# Patient Record
Sex: Male | Born: 1995 | Race: White | Hispanic: No | Marital: Single | Smoking: Never smoker
Health system: Southern US, Community
[De-identification: ages and names within clinical notes are randomized; demographics above are authoritative.]

## PROBLEM LIST (undated history)

## (undated) DIAGNOSIS — J45909 Unspecified asthma, uncomplicated: Secondary | ICD-10-CM

## (undated) HISTORY — PX: ADENOIDECTOMY: SUR15

## (undated) HISTORY — PX: CIRCUMCISION: SUR203

## (undated) HISTORY — PX: TYMPANOSTOMY TUBE PLACEMENT: SHX32

---
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
## 2019-02-16 ENCOUNTER — Ambulatory Visit (INDEPENDENT_AMBULATORY_CARE_PROVIDER_SITE_OTHER): Payer: 59 | Admitting: Internal Medicine

## 2019-02-16 ENCOUNTER — Other Ambulatory Visit: Payer: Self-pay

## 2019-02-16 ENCOUNTER — Encounter: Payer: Self-pay | Admitting: Internal Medicine

## 2019-02-16 VITALS — BP 118/70 | HR 94 | Ht 71.0 in | Wt 169.0 lb

## 2019-02-16 DIAGNOSIS — J4520 Mild intermittent asthma, uncomplicated: Secondary | ICD-10-CM | POA: Diagnosis not present

## 2019-02-16 DIAGNOSIS — D171 Benign lipomatous neoplasm of skin and subcutaneous tissue of trunk: Secondary | ICD-10-CM | POA: Insufficient documentation

## 2019-02-16 NOTE — Patient Instructions (Signed)
Preventive Care for [place], Male The transition to life after high school as a young adult can be a stressful time with many changes. You may start seeing a primary care physician instead of a pediatrician. This is the time when your health care becomes your responsibility. Preventive care refers to lifestyle choices and visits with your health care provider that can promote health and wellness. What does preventive care include?  A yearly physical exam. This is also called an annual wellness visit.  Dental exams once or twice a year.  Routine eye exams. Ask your health care provider how often you should have your eyes checked.  Personal lifestyle choices, including: ? Daily care of your teeth and gums. ? Regular physical activity. ? Eating a healthy diet. ? Avoiding tobacco and drug use. ? Avoiding or limiting alcohol use. ? Practicing safe sex. What happens during an annual wellness visit? Preventive care starts with a yearly visit to your primary care physician. The services and screenings done by your health care provider during your annual wellness visit will depend on your overall health, lifestyle risk factors, and family history of disease. Counseling Your health care provider may ask you questions about:  Past medical problems and your family's medical history.  Medicines or supplements that you take.  Health insurance and access to health care.  Alcohol, tobacco, and drug use, including use of any bodybuilding drugs (anabolic steroids).  Your safety at home, work, or school.  Access to firearms.  Emotional well-being and how you cope with stress.  Relationship well-being.  Diet, exercise, and sleep habits.  Your sexual health and activity. Screening You may have the following tests or measurements:  Height, weight, and BMI.  Blood pressure.  Lipid and cholesterol levels.  Tuberculosis skin test.  Skin exam.  Vision and hearing tests.  Genital  exam to check for testicular cancer or hernias.  Screening test for hepatitis.  Screening tests for STDs (sexually transmitted diseases), if you are at risk. Vaccines Your health care provider may recommend certain vaccines, such as:  Influenza vaccine. This is recommended every year.  Tetanus, diphtheria, and acellular pertussis (Tdap, Td) vaccine. You may need a Td booster every 10 years.  Varicella vaccine. You may need this if you have not been vaccinated.  HPV vaccine. If you are 100 or younger, you may need three doses over 6 months.  Measles, mumps, and rubella (MMR) vaccine. You may need at least one dose of MMR. You may also need a second dose.  Pneumococcal 13-valent conjugate (PCV13) vaccine. You may need this if you have certain conditions and have not been vaccinated.  Pneumococcal polysaccharide (PPSV23) vaccine. You may need one or two doses if you smoke cigarettes or if you have certain conditions.  Meningococcal vaccine. One dose is recommended if you are age 48-21 years and a first-year college student living in a residence hall, or if you have one of several medical conditions. You may also need additional booster doses.  Hepatitis A vaccine. You may need this if you have certain conditions or if you travel or work in places where you may be exposed to hepatitis A.  Hepatitis B vaccine. You may need this if you have certain conditions or if you travel or work in places where you may be exposed to hepatitis B.  Haemophilus influenzae type b (Hib) vaccine. You may need this if you have certain risk factors. Talk to your health care provider about which screenings and vaccines  you need and how often you need them. What steps can I take to develop healthy behaviors?      Have regular preventive health care visits with your primary care physician and dentist.  Eat a healthy diet.  Drink enough fluid to keep your urine clear or pale yellow.  Stay active. Exercise  at least 30 minutes 5 or more days of the week.  Use alcohol responsibly.  Maintain a healthy weight.  Do not use any products that contain nicotine, such as cigarettes, chewing tobacco, and e-cigarettes. If you need help quitting, ask your health care provider.  Do not use drugs.  Practice safe sex. This includes using condoms to prevent STDs or an unwanted pregnancy.  Find healthy ways to manage stress. How can I protect myself from injury? Injuries from violence or accidents are the leading cause of death among young adults and can often be prevented. Take these steps to help protect yourself:  Always wear your seat belt while driving or riding in a vehicle.  Do not drive if you have been drinking alcohol. Do not ride with someone who has been drinking.  Do not drive when you are tired or distracted. Do not text while driving.  Wear a helmet and other protective equipment during sports activities.  If you have firearms in your house, make sure you follow all gun safety procedures.  Seek help if you have been bullied, physically abused, or sexually abused.  Avoid fighting.  Use the Internet responsibly to avoid dangers such as online bullying. What can I do to cope with stress? Young adults may face many new challenges that can be stressful, such as finding a job, going to college, moving away from home, managing money, being in a relationship, getting married, and having children. To manage stress:  Avoid known stressful situations when you can.  Exercise regularly.  Find a stress-reducing activity that works best for you. Examples include meditation, yoga, listening to music, or reading.  Spend time in nature.  Keep a journal to write about your stress and how you respond.  Talk to your health care provider about stress. He or she may suggest counseling.  Spend time with supportive friends or family.  Do not cope with stress by: ? Drinking alcohol or using drugs.  ? Smoking cigarettes. ? Eating. Where can I get more information? Learn more about preventive care and healthy habits from:  U.S. Preventive Services Task Force: StageSync.si  National Adolescent and [place]: StrategicRoad.nl  American Academy of Pediatrics Bright Futures: https://brightfutures.MemberVerification.co.za  Society for Adolescent Health and Medicine: MoralBlog.co.za.aspx  PodExchange.nl: ToyLending.fr This information is not intended to replace advice given to you by your health care provider. Make sure you discuss any questions you have with your health care provider. Document Released: 01/08/2016 Document Revised: 04/05/2017 Document Reviewed: 01/08/2016 Elsevier Interactive Patient Education  2019 [employer].

## 2019-02-16 NOTE — Progress Notes (Signed)
Date:  02/16/2019   Name:  William Ray   DOB:  01/31/1996   MRN:  [id number]  New patient to establish care. Chief Complaint: No chief complaint on file.  Asthma He complains of chest tightness. There is no cough, shortness of breath or wheezing. This is a recurrent problem. Pertinent negatives include no chest pain, fever, headaches or trouble swallowing. His symptoms are alleviated by beta-agonist and steroid inhaler. He reports significant improvement on treatment. His past medical history is significant for asthma.   Abd wall mass - he noticed a lump on his left side that has been there a long time and is not changing but he would like to have it looked at.  It is not tender and does not interfere with any activity.  Review of Systems  Constitutional: Negative for chills, fatigue and fever.  HENT: Negative for sinus pressure and trouble swallowing.   Eyes: Negative for visual disturbance.  Respiratory: Negative for cough, chest tightness, shortness of breath and wheezing.   Cardiovascular: Negative for chest pain, palpitations and leg swelling.  Gastrointestinal: Negative for abdominal pain, constipation and diarrhea.  Musculoskeletal: Negative for back pain.  Skin:       Mass on left abdominal wall  Allergic/Immunologic: Negative for environmental allergies.  Neurological: Negative for dizziness and headaches.  Hematological: Negative for adenopathy.  Psychiatric/Behavioral: Negative for dysphoric mood and sleep disturbance. The patient is not nervous/anxious.     There are no active problems to display for this patient.   Allergies  Allergen Reactions  . Tylenol [Acetaminophen] Shortness Of Breath    Shallow breathing  . Other     Seasonal allergies, Corn syrup, Coconut. (Hives)    Past Surgical History:  Procedure Laterality Date  . ADENOIDECTOMY    . CIRCUMCISION    . TYMPANOSTOMY TUBE PLACEMENT Bilateral     Social History   Tobacco Use  . Smoking  status: Never Smoker  . Smokeless tobacco: Never Used  Substance Use Topics  . Alcohol use: Yes    Comment: ocassional  . Drug use: No     Medication list has been reviewed and updated.  Current Meds  Medication Sig  . albuterol (PROVENTIL HFA;VENTOLIN HFA) 108 (90 BASE) MCG/ACT inhaler Inhale 1-2 puffs into the lungs every 6 (six) hours as needed for wheezing or shortness of breath. Or cough  . beclomethasone (QVAR) 40 MCG/ACT inhaler Inhale 2 puffs into the lungs daily.  . diphenhydrAMINE (BENADRYL) 25 MG tablet Take 50 mg by mouth at bedtime.    PHQ 2/9 Scores 02/16/2019  PHQ - 2 Score 0  PHQ- 9 Score 0    BP Readings from Last 3 Encounters:  02/16/19 118/70  07/15/15 114/80  03/15/14 131/58 (89 %, Z = 1.24 /  16 %, Z = -1.00)*   *BP percentiles are based on the 2017 AAP Clinical Practice Guideline for boys    Physical Exam Vitals signs and nursing note reviewed.  Constitutional:      General: He is not in acute distress.    Appearance: He is well-developed.  HENT:     Head: Normocephalic and atraumatic.  Neck:     Musculoskeletal: Normal range of motion.  Cardiovascular:     Rate and Rhythm: Normal rate and regular rhythm.     Pulses: Normal pulses.     Heart sounds: No murmur.  Pulmonary:     Effort: Pulmonary effort is normal. No respiratory distress.     Breath  sounds: No wheezing or rhonchi.  Abdominal:     General: Abdomen is flat.     Palpations: Abdomen is soft.     Tenderness: There is no abdominal tenderness.    Musculoskeletal: Normal range of motion.     Right lower leg: No edema.     Left lower leg: No edema.  Lymphadenopathy:     Cervical: No cervical adenopathy.  Skin:    General: Skin is warm and dry.     Capillary Refill: Capillary refill takes less than 2 seconds.     Findings: No rash.  Neurological:     Mental Status: He is alert and oriented to person, place, and time.  Psychiatric:        Behavior: Behavior normal.         Thought Content: Thought content normal.    Wt Readings from Last 3 Encounters:  02/16/19 169 lb (76.7 kg)  07/15/15 140 lb (63.5 kg) (35 %, Z= -0.39)*  03/15/14 146 lb 11.2 oz (66.5 kg) (59 %, Z= 0.23)*   * Growth percentiles are based on CDC (Boys, 2-20 Years) data.     BP 118/70   Pulse 94   Ht 5\' 11"  (1.803 m)   Wt 169 lb (76.7 kg)   SpO2 97%   BMI 23.57 kg/m   Assessment and Plan: 1. Mild intermittent asthma without complication Continue Albuterol PRN Does not use Qvar other than intermittently   2. Lipoma of abdominal wall Pt reassured  Requested pt to bring or mail a copy of his immunization records. Partially dictated using Editor, commissioning. Any errors are unintentional.  Halina Maidens, MD [facility]  02/16/2019
